# Patient Record
Sex: Female | Born: 1935 | Race: Black or African American | Hispanic: No | Smoking: Never smoker
Health system: Southern US, Community
[De-identification: ages and names within clinical notes are randomized; demographics above are authoritative.]

## PROBLEM LIST (undated history)

## (undated) DIAGNOSIS — F0390 Unspecified dementia without behavioral disturbance: Secondary | ICD-10-CM

## (undated) DIAGNOSIS — I050 Rheumatic mitral stenosis: Secondary | ICD-10-CM

## (undated) DIAGNOSIS — I10 Essential (primary) hypertension: Secondary | ICD-10-CM

## (undated) DIAGNOSIS — E785 Hyperlipidemia, unspecified: Secondary | ICD-10-CM

---

## 2007-05-10 ENCOUNTER — Ambulatory Visit (HOSPITAL_COMMUNITY): Admission: RE | Admit: 2007-05-10 | Discharge: 2007-05-11 | Payer: Self-pay | Admitting: Ophthalmology

## 2011-01-11 NOTE — Op Note (Signed)
Terry Shaw, Terry Shaw                  ACCOUNT NO.:  [id number]   MEDICAL RECORD NO.:  [id number]          PATIENT TYPE:  AMB   LOCATION:  SDS                          FACILITY:  [facility]   PHYSICIAN:  John D. Ashley Royalty, M.D. DATE OF BIRTH:  [birth year]   DATE OF PROCEDURE:  05/10/2007  DATE OF DISCHARGE:                               OPERATIVE REPORT   ADMISSION DIAGNOSES:  Retained lens material, glaucoma, capsular  remnants in the right eye.   PROCEDURES:  Pars plana vitrectomy with 25-gauge system, membrane peel,  capsulectomy, retinal photocoagulation in the right eye.   SURGEON:  Beulah Gandy. Ashley Royalty, M.D.   ASSISTANT:  Bryan Lemma. Lundquist, P.A.   ANESTHESIA:  General.   DETAILS:  Usual prep and drape, 25-gauge trocars at 8, 10 and 2 o'clock.  The infusion at 8 o'clock.  Provisc placed on the corneal surface.  The  lighted pick and the vitreous cutter were placed at 10 and 2 o'clock,  respectively.  Attention was carried to the anterior segment, where  vitreous attached to the iris and wound were encountered and carefully  removed under low suction and rapid cutting.  Capsular remnants were  seen as well as lens remnants seen in the anterior chamber.  These were  sucked from the edge of the lens from their position to the edge of the  lens in and then out to the vitreous cutter from behind.  The capsular  ring was trimmed and large clumps of lens material were encountered and  removed.  The lens appeared stable.  The vitrectomy was carried  posteriorly, where additional vitrectomy was carried out down to the  vitreous base and down to the macular surface.  The lighted pick was  used to engage the lens material itself and pick it from its attachments  to the retina, and the pick was used to engage posterior membranes on  the retinal surface and peel them from their attachments.  They were  removed with the vitreous cutter.  The eye was rotated extremely in all  directions so as to find  each and every lens particle.  The eye was  rinsed for a long period of time and a diligent search was made around  the implant and in the posterior pole to remove all lens particles.  The  attention was then carried back anteriorly again and additional trimming  of the posterior capsule was performed.  Then attention was carried  posteriorly again, where Endolaser was performed, 604 burns were placed  around the retinal periphery with a power of 1000 milliwatts, 1000  microns each and 0.1 second each.  The indirect ophthalmoscope laser was  also used to deliver 558 burns at the power of 660 milliwatts, 1000  microns each and 0.1 second each.  An additional washout procedure was  performed, each time finding small single lens remnants.  They were  brought into the vitrector and out of the eye.  Once the entire eye was  cleansed, the trocars were removed and the wounds were held until tight.  The conjunctiva was allowed  to slide over the scleral wounds.  Polymyxin  and gentamicin were irrigated into Tenon's space.  Marcaine was injected  around the globe for postop pain.  Decadron 10 mg was injected to the  lower subconjunctival space.  Closing pressure was less than 10 with a  [place] tonometer.   COMPLICATIONS:  None.   DURATION:  1 hour.   TobraDex ophthalmic ointment, a patch and shield were placed.  The  patient was awakened and taken to recovery in satisfactory condition.      Beulah Gandy. Ashley Royalty, M.D.  Electronically Signed     JDM/MEDQ  D:  05/10/2007  T:  05/11/2007  Job:  [id number]

## 2011-06-10 LAB — BASIC METABOLIC PANEL
BUN: 24 — ABNORMAL HIGH
Chloride: 110
Glucose, Bld: 101 — ABNORMAL HIGH
Potassium: 3.4 — ABNORMAL LOW
Sodium: 137

## 2011-06-10 LAB — CBC
HCT: 38.6
Hemoglobin: 12.8
MCV: 90.3
Platelets: 281
RDW: 13.5

## 2021-07-31 ENCOUNTER — Other Ambulatory Visit: Payer: Self-pay

## 2021-07-31 ENCOUNTER — Emergency Department: Payer: Medicare HMO

## 2021-07-31 ENCOUNTER — Encounter: Payer: Self-pay | Admitting: Interventional Cardiology

## 2021-07-31 ENCOUNTER — Encounter: Admission: EM | Disposition: E | Payer: Self-pay | Source: Home / Self Care | Attending: Internal Medicine

## 2021-07-31 DIAGNOSIS — T68XXXA Hypothermia, initial encounter: Secondary | ICD-10-CM

## 2021-07-31 DIAGNOSIS — Z79890 Hormone replacement therapy (postmenopausal): Secondary | ICD-10-CM

## 2021-07-31 DIAGNOSIS — I4510 Unspecified right bundle-branch block: Secondary | ICD-10-CM | POA: Diagnosis present

## 2021-07-31 DIAGNOSIS — I213 ST elevation (STEMI) myocardial infarction of unspecified site: Secondary | ICD-10-CM | POA: Diagnosis not present

## 2021-07-31 DIAGNOSIS — I9589 Other hypotension: Secondary | ICD-10-CM | POA: Diagnosis present

## 2021-07-31 DIAGNOSIS — R64 Cachexia: Secondary | ICD-10-CM | POA: Diagnosis present

## 2021-07-31 DIAGNOSIS — Z6829 Body mass index (BMI) 29.0-29.9, adult: Secondary | ICD-10-CM | POA: Diagnosis not present

## 2021-07-31 DIAGNOSIS — J690 Pneumonitis due to inhalation of food and vomit: Secondary | ICD-10-CM | POA: Diagnosis present

## 2021-07-31 DIAGNOSIS — D72829 Elevated white blood cell count, unspecified: Secondary | ICD-10-CM | POA: Diagnosis present

## 2021-07-31 DIAGNOSIS — F03C0 Unspecified dementia, severe, without behavioral disturbance, psychotic disturbance, mood disturbance, and anxiety: Secondary | ICD-10-CM | POA: Diagnosis present

## 2021-07-31 DIAGNOSIS — I2109 ST elevation (STEMI) myocardial infarction involving other coronary artery of anterior wall: Principal | ICD-10-CM | POA: Diagnosis present

## 2021-07-31 DIAGNOSIS — E039 Hypothyroidism, unspecified: Secondary | ICD-10-CM | POA: Diagnosis present

## 2021-07-31 DIAGNOSIS — R54 Age-related physical debility: Secondary | ICD-10-CM | POA: Diagnosis present

## 2021-07-31 DIAGNOSIS — N939 Abnormal uterine and vaginal bleeding, unspecified: Secondary | ICD-10-CM | POA: Diagnosis present

## 2021-07-31 DIAGNOSIS — Z20822 Contact with and (suspected) exposure to covid-19: Secondary | ICD-10-CM | POA: Diagnosis present

## 2021-07-31 DIAGNOSIS — N1832 Chronic kidney disease, stage 3b: Secondary | ICD-10-CM | POA: Diagnosis present

## 2021-07-31 DIAGNOSIS — I050 Rheumatic mitral stenosis: Secondary | ICD-10-CM | POA: Diagnosis present

## 2021-07-31 DIAGNOSIS — F0390 Unspecified dementia without behavioral disturbance: Secondary | ICD-10-CM

## 2021-07-31 DIAGNOSIS — E785 Hyperlipidemia, unspecified: Secondary | ICD-10-CM | POA: Diagnosis present

## 2021-07-31 DIAGNOSIS — Z66 Do not resuscitate: Secondary | ICD-10-CM | POA: Diagnosis present

## 2021-07-31 DIAGNOSIS — R680 Hypothermia, not associated with low environmental temperature: Secondary | ICD-10-CM | POA: Diagnosis present

## 2021-07-31 DIAGNOSIS — E782 Mixed hyperlipidemia: Secondary | ICD-10-CM | POA: Diagnosis not present

## 2021-07-31 DIAGNOSIS — I462 Cardiac arrest due to underlying cardiac condition: Secondary | ICD-10-CM | POA: Diagnosis present

## 2021-07-31 DIAGNOSIS — I129 Hypertensive chronic kidney disease with stage 1 through stage 4 chronic kidney disease, or unspecified chronic kidney disease: Secondary | ICD-10-CM | POA: Diagnosis present

## 2021-07-31 DIAGNOSIS — I472 Ventricular tachycardia, unspecified: Secondary | ICD-10-CM | POA: Diagnosis present

## 2021-07-31 DIAGNOSIS — R0789 Other chest pain: Secondary | ICD-10-CM | POA: Diagnosis present

## 2021-07-31 DIAGNOSIS — I10 Essential (primary) hypertension: Secondary | ICD-10-CM | POA: Diagnosis not present

## 2021-07-31 DIAGNOSIS — N183 Chronic kidney disease, stage 3 unspecified: Secondary | ICD-10-CM | POA: Diagnosis present

## 2021-07-31 DIAGNOSIS — Z79899 Other long term (current) drug therapy: Secondary | ICD-10-CM | POA: Diagnosis not present

## 2021-07-31 HISTORY — DX: Rheumatic mitral stenosis: I05.0

## 2021-07-31 HISTORY — DX: Hyperlipidemia, unspecified: E78.5

## 2021-07-31 HISTORY — DX: Unspecified dementia, unspecified severity, without behavioral disturbance, psychotic disturbance, mood disturbance, and anxiety: F03.90

## 2021-07-31 HISTORY — DX: Essential (primary) hypertension: I10

## 2021-07-31 LAB — CBC WITH DIFFERENTIAL/PLATELET
Abs Immature Granulocytes: 0.15 10*3/uL — ABNORMAL HIGH (ref 0.00–0.07)
Basophils Absolute: 0.1 10*3/uL (ref 0.0–0.1)
Basophils Relative: 1 %
Eosinophils Absolute: 0.2 10*3/uL (ref 0.0–0.5)
Eosinophils Relative: 2 %
HCT: 40.4 % (ref 36.0–46.0)
Hemoglobin: 12.6 g/dL (ref 12.0–15.0)
Immature Granulocytes: 1 %
Lymphocytes Relative: 54 %
Lymphs Abs: 6.8 10*3/uL — ABNORMAL HIGH (ref 0.7–4.0)
MCH: 29.4 pg (ref 26.0–34.0)
MCHC: 31.2 g/dL (ref 30.0–36.0)
MCV: 94.2 fL (ref 80.0–100.0)
Monocytes Absolute: 0.7 10*3/uL (ref 0.1–1.0)
Monocytes Relative: 5 %
Neutro Abs: 4.5 10*3/uL (ref 1.7–7.7)
Neutrophils Relative %: 37 %
Platelets: 180 10*3/uL (ref 150–400)
RBC: 4.29 MIL/uL (ref 3.87–5.11)
RDW: 14.6 % (ref 11.5–15.5)
WBC: 12.4 10*3/uL — ABNORMAL HIGH (ref 4.0–10.5)
nRBC: 0.2 % (ref 0.0–0.2)

## 2021-07-31 LAB — COMPREHENSIVE METABOLIC PANEL
ALT: 51 U/L — ABNORMAL HIGH (ref 0–44)
AST: 91 U/L — ABNORMAL HIGH (ref 15–41)
Albumin: 3.1 g/dL — ABNORMAL LOW (ref 3.5–5.0)
Alkaline Phosphatase: 67 U/L (ref 38–126)
Anion gap: 8 (ref 5–15)
BUN: 35 mg/dL — ABNORMAL HIGH (ref 8–23)
CO2: 17 mmol/L — ABNORMAL LOW (ref 22–32)
Calcium: 7.3 mg/dL — ABNORMAL LOW (ref 8.9–10.3)
Chloride: 114 mmol/L — ABNORMAL HIGH (ref 98–111)
Creatinine, Ser: 1.34 mg/dL — ABNORMAL HIGH (ref 0.44–1.00)
GFR, Estimated: 37 mL/min — ABNORMAL LOW (ref >60)
Glucose, Bld: 165 mg/dL — ABNORMAL HIGH (ref 70–99)
Potassium: 3.7 mmol/L (ref 3.5–5.1)
Sodium: 139 mmol/L (ref 135–145)
Total Bilirubin: 1.1 mg/dL (ref 0.3–1.2)
Total Protein: 5.5 g/dL — ABNORMAL LOW (ref 6.5–8.1)

## 2021-07-31 LAB — PROTIME-INR
INR: 1.4 — ABNORMAL HIGH (ref 0.8–1.2)
Prothrombin Time: 17.1 seconds — ABNORMAL HIGH (ref 11.4–15.2)

## 2021-07-31 LAB — LIPID PANEL
Cholesterol: 125 mg/dL (ref 0–200)
HDL: 43 mg/dL (ref >40)
LDL Cholesterol: 60 mg/dL (ref 0–99)
Total CHOL/HDL Ratio: 2.9 RATIO
Triglycerides: 112 mg/dL (ref <150)
VLDL: 22 mg/dL (ref 0–40)

## 2021-07-31 LAB — GLUCOSE, CAPILLARY: Glucose-Capillary: 126 mg/dL — ABNORMAL HIGH (ref 70–99)

## 2021-07-31 LAB — TROPONIN I (HIGH SENSITIVITY)
Troponin I (High Sensitivity): 50 ng/L — ABNORMAL HIGH (ref <18)
Troponin I (High Sensitivity): 23315 ng/L (ref <18)

## 2021-07-31 LAB — RESP PANEL BY RT-PCR (FLU A&B, COVID) ARPGX2
Influenza A by PCR: NEGATIVE
Influenza B by PCR: NEGATIVE
SARS Coronavirus 2 by RT PCR: NEGATIVE

## 2021-07-31 LAB — APTT: aPTT: 35 seconds (ref 24–36)

## 2021-07-31 SURGERY — CORONARY/GRAFT ACUTE MI REVASCULARIZATION

## 2021-07-31 MED ORDER — AMIODARONE IV BOLUS ONLY 150 MG/100ML
150.0000 mg | Freq: Once | INTRAVENOUS | Status: AC
  Administered 2021-07-31: 150 mg via INTRAVENOUS
  Filled 2021-07-31: qty 100

## 2021-07-31 MED ORDER — HEPARIN (PORCINE) 25000 UT/250ML-% IV SOLN
INTRAVENOUS | Status: AC
  Administered 2021-07-31: 14 [IU]/kg/h via INTRAVENOUS
  Filled 2021-07-31: qty 250

## 2021-07-31 MED ORDER — ASPIRIN 81 MG PO CHEW: 324.0000 mg | CHEWABLE_TABLET | Freq: Once | ORAL | Status: DC

## 2021-07-31 MED ORDER — SODIUM CHLORIDE 0.9 % IV SOLN
INTRAVENOUS | Status: DC
  Administered 2021-07-31: 1000 mL via INTRAVENOUS

## 2021-07-31 MED ORDER — SODIUM CHLORIDE 0.9 % IV SOLN
6.2500 mg | Freq: Four times a day (QID) | INTRAVENOUS | Status: DC | PRN
  Filled 2021-07-31 (×2): qty 0.25

## 2021-07-31 MED ORDER — LEVOTHYROXINE SODIUM 112 MCG PO TABS
112.0000 ug | ORAL_TABLET | Freq: Every day | ORAL | Status: DC
  Filled 2021-07-31: qty 1

## 2021-07-31 MED ORDER — ACETAMINOPHEN 650 MG RE SUPP: 650.0000 mg | Freq: Four times a day (QID) | RECTAL | Status: DC | PRN

## 2021-07-31 MED ORDER — PIPERACILLIN-TAZOBACTAM 3.375 G IVPB
3.3750 g | Freq: Three times a day (TID) | INTRAVENOUS | Status: DC
  Administered 2021-08-01: 01:00:00 3.375 g via INTRAVENOUS
  Filled 2021-07-31: qty 50

## 2021-07-31 MED ORDER — INSULIN ASPART 100 UNIT/ML IJ SOLN: 0.0000 [IU] | Freq: Three times a day (TID) | INTRAMUSCULAR | Status: DC

## 2021-07-31 MED ORDER — MORPHINE SULFATE (PF) 2 MG/ML IV SOLN
2.0000 mg | Freq: Once | INTRAVENOUS | Status: AC
  Administered 2021-07-31: 2 mg via INTRAVENOUS
  Filled 2021-07-31: qty 1

## 2021-07-31 MED ORDER — AMIODARONE IV BOLUS ONLY 150 MG/100ML: 150.0000 mg | Freq: Once | INTRAVENOUS | Status: DC | PRN

## 2021-07-31 MED ORDER — HEPARIN (PORCINE) 25000 UT/250ML-% IV SOLN: 1150.0000 [IU]/h | INTRAVENOUS | Status: DC

## 2021-07-31 MED ORDER — MORPHINE SULFATE (PF) 2 MG/ML IV SOLN: 2.0000 mg | INTRAVENOUS | Status: DC | PRN

## 2021-07-31 MED ORDER — CHLORHEXIDINE GLUCONATE CLOTH 2 % EX PADS
6.0000 | MEDICATED_PAD | Freq: Every day | CUTANEOUS | Status: DC
  Filled 2021-07-31: qty 6

## 2021-07-31 MED ORDER — LIDOCAINE HCL 1 % IJ SOLN
INTRAMUSCULAR | Status: AC
  Filled 2021-07-31: qty 20

## 2021-07-31 MED ORDER — ONDANSETRON HCL 4 MG/2ML IJ SOLN
4.0000 mg | Freq: Once | INTRAMUSCULAR | Status: AC
  Administered 2021-07-31: 4 mg via INTRAVENOUS
  Filled 2021-07-31: qty 2

## 2021-07-31 MED ORDER — INSULIN ASPART 100 UNIT/ML IJ SOLN: 0.0000 [IU] | Freq: Every day | INTRAMUSCULAR | Status: DC

## 2021-07-31 MED ORDER — ATORVASTATIN CALCIUM 20 MG PO TABS
80.0000 mg | ORAL_TABLET | Freq: Every day | ORAL | Status: DC
  Administered 2021-08-01: 01:00:00 80 mg via ORAL

## 2021-07-31 MED ORDER — ACETAMINOPHEN 500 MG PO TABS: 1000.0000 mg | ORAL_TABLET | Freq: Four times a day (QID) | ORAL | Status: DC | PRN

## 2021-07-31 MED ORDER — MIDAZOLAM HCL 2 MG/2ML IJ SOLN: 2.0000 mg | INTRAMUSCULAR | Status: DC | PRN

## 2021-07-31 MED ORDER — ONDANSETRON HCL 4 MG/2ML IJ SOLN: 4.0000 mg | Freq: Four times a day (QID) | INTRAMUSCULAR | Status: DC | PRN

## 2021-07-31 MED ORDER — HEPARIN SODIUM (PORCINE) 5000 UNIT/ML IJ SOLN
60.0000 [IU]/kg | Freq: Once | INTRAMUSCULAR | Status: AC
  Administered 2021-07-31: 4000 [IU] via INTRAVENOUS

## 2021-07-31 MED ORDER — ONDANSETRON HCL 4 MG PO TABS: 4.0000 mg | ORAL_TABLET | Freq: Four times a day (QID) | ORAL | Status: DC | PRN

## 2021-07-31 NOTE — Consult Note (Signed)
Pharmacy Antibiotic Note  Terry Shaw is a 87 y.o. female admitted on 07/30/2021 with  aspiration pneumonia .  Pharmacy has been consulted for Zosyn dosing.  Plan: Zosyn 3.375g IV q8h (4 hour infusion).  Height: 5\' 6"  (167.6 cm) Weight: 81.6 kg (180 lb) IBW/kg (Calculated) : 59.3  Temp (24hrs), Avg:95.9 F (35.5 C), Min:95.9 F (35.5 C), Max:95.9 F (35.5 C)  Recent Labs  Lab 08/02/2021 1650  WBC 12.4*  CREATININE 1.34*    Estimated Creatinine Clearance: 27.6 mL/min (A) (by C-G formula based on SCr of 1.34 mg/dL (H)).    No Known Allergies  Antimicrobials this admission: 12/3 Zosyn >>   Dose adjustments this admission: none  Microbiology results: 12/3 MRSA PCR: pending  Thank you for allowing pharmacy to be a part of this patient's care.  Ahmadou Bolz Rodriguez-Guzman PharmD, BCPS 08/19/2021 10:01 PM

## 2021-07-31 NOTE — ED Provider Notes (Signed)
[facility] Emergency Department Provider Note  ____________________________________________  Time seen: Approximately 6:24 PM  I have reviewed the triage vital signs and the nursing notes.   HISTORY  Chief Complaint Chest Pain    Level 5 Caveat: Portions of the History and Physical including HPI and review of systems are unable to be completely obtained due to patient advanced dementia  HPI Terry Shaw is a 87 y.o. female with a history of hypertension hyperlipidemia and dementia who was riding in a car driven by her daughter when she suddenly appeared ill and in distress.  The daughter pulled the car over and called EMS.  On their arrival they performed an EKG which showed a clear STEMI.  While placing an IV, the patient had a loss of pulses which they determined to be PEA.  They gave 1 amp of epinephrine and 15 minutes of CPR with return of spontaneous circulation.  On arrival, patient does not respond to questioning, but is clutching her chest and crying.    Past Medical History:  Diagnosis Date   Dementia (HCC)    Hyperlipidemia    Hypertension    Mitral stenosis      Patient Active Problem List   Diagnosis Date Noted   STEMI (ST elevation myocardial infarction) (HCC) 08/07/2021        Prior to Admission medications   Medication Sig Start Date End Date Taking? Authorizing Provider  benazepril (LOTENSIN) 20 MG tablet Take 20 mg by mouth daily. 07/26/21  Yes [provider]  citalopram (CELEXA) 10 MG tablet Take 10 mg by mouth daily. 07/26/21  Yes [provider]  donepezil (ARICEPT) 5 MG tablet Take 5 mg by mouth daily. 07/26/21  Yes [provider]  fluticasone (FLONASE) 50 MCG/ACT nasal spray Place 2 sprays into both nostrils daily at 6 (six) AM. 07/26/21  Yes [provider]  levothyroxine (SYNTHROID) 112 MCG tablet Take 112 mcg by mouth daily. 07/26/21  Yes [provider]  lovastatin (MEVACOR)  40 MG tablet Take 40 mg by mouth daily. 07/26/21  Yes [provider]  memantine (NAMENDA XR) 14 MG CP24 24 hr capsule Take 14 mg by mouth daily at 6 (six) AM. 07/26/21  Yes [provider]     Allergies Patient has no allergy information on record.   No family history on file.  Social History    Review of Systems Level 5 Caveat: Portions of the History and Physical including HPI and review of systems are unable to be completely obtained due to patient being a poor historian   Constitutional:   No known fever.  ENT:   No rhinorrhea. Cardiovascular:   Positive chest pain . Respiratory:   No dyspnea or cough. Gastrointestinal:   Negative for abdominal pain, vomiting and diarrhea.  Musculoskeletal:   Negative for focal pain or swelling ____________________________________________   PHYSICAL EXAM:  VITAL SIGNS: ED Triage Vitals  Enc Vitals Group     BP 07/29/2021 1700 92/60     Pulse Rate 08/08/2021 1700 66     Resp 08/13/2021 1700 (!) 22     Temp 08/21/2021 1743 (!) 95.9 F (35.5 C)     Temp Source 07/30/2021 1743 Axillary     SpO2 08/28/2021 1700 92 %     Weight 08/03/2021 1702 180 lb (81.6 kg)     Height 07/31/21 1702 5\' 6"  (1.676 m)     Head Circumference --      Peak Flow --  Pain Score 07/30/2021 1645 10     Pain Loc --      Pain Edu? --      Excl. in GC? --     Vital signs reviewed, nursing assessments reviewed.   Constitutional:   Awake, not alert, not oriented.  In distress. Eyes:   Conjunctivae are normal.  PERRL. ENT      Head:   Normocephalic and atraumatic.      Nose:   No congestion/rhinnorhea.             Neck:   No meningismus. Full ROM. Hematological/Lymphatic/Immunilogical:   No cervical lymphadenopathy. Cardiovascular:   Tachycardia heart rate 110. Symmetric bilateral radial and DP pulses.  No murmurs. Cap refill less than 2 seconds. Respiratory:   Normal respiratory effort without tachypnea/retractions. Breath sounds are clear and  equal bilaterally. No wheezes/rales/rhonchi. Gastrointestinal:   Soft and nontender. Non distended. There is no CVA tenderness.  No rebound, rigidity, or guarding. Genitourinary:   deferred Musculoskeletal:   Normal range of motion in all extremities. No joint effusions.  No lower extremity tenderness.  No edema. Neurologic:   Normal speech, very limited language, not conversant..  Motor grossly intact.  Skin:    Skin is warm, dry and intact. No rash noted.  No petechiae, purpura, or bullae.  ____________________________________________    LABS (pertinent positives/negatives) (all labs ordered are listed, but only abnormal results are displayed) Labs Reviewed  CBC WITH DIFFERENTIAL/PLATELET - Abnormal; Notable for the following components:      Result Value   WBC 12.4 (*)    Lymphs Abs 6.8 (*)    Abs Immature Granulocytes 0.15 (*)    All other components within normal limits  PROTIME-INR - Abnormal; Notable for the following components:   Prothrombin Time 17.1 (*)    INR 1.4 (*)    All other components within normal limits  COMPREHENSIVE METABOLIC PANEL - Abnormal; Notable for the following components:   Chloride 114 (*)    CO2 17 (*)    Glucose, Bld 165 (*)    BUN 35 (*)    Creatinine, Ser 1.34 (*)    Calcium 7.3 (*)    Total Protein 5.5 (*)    Albumin 3.1 (*)    AST 91 (*)    ALT 51 (*)    GFR, Estimated 37 (*)    All other components within normal limits  TROPONIN I (HIGH SENSITIVITY) - Abnormal; Notable for the following components:   Troponin I (High Sensitivity) 50 (*)    All other components within normal limits  RESP PANEL BY RT-PCR (FLU A&B, COVID) ARPGX2  APTT  LIPID PANEL  HEMOGLOBIN A1C  HEPARIN LEVEL (UNFRACTIONATED)  CBC  TROPONIN I (HIGH SENSITIVITY)   ____________________________________________   EKG  All EKGs interpreted by me Prehospital EKG shows a clear STEMI with ST elevation in 1, aVL, V4 through V6 and ST depression in the inferior  leads.  Initial ED EKG shows sinus tachycardia, rate of 113.  Left axis, right bundle branch block, normal ST segments and T waves, no acute ischemic changes.  BP patient's repeat EKG shows sinus rhythm with first-degree AV block, rate of 70, right bundle branch block, frequent bigeminy.  ____________________________________________    RADIOLOGY  DG Chest Port 1 View  Result Date: 08/06/2021 CLINICAL DATA:  Chest pain EXAM: PORTABLE CHEST 1 VIEW COMPARISON:  05/10/2007 FINDINGS: There is poor inspiration. Transverse diameter of heart is increased. Central pulmonary vessels are prominent. There is no  focal pulmonary consolidation. Left hemidiaphragm is elevated. There is blunting of left lateral CP angle. There is no pneumothorax. Severe degenerative changes are noted in the left shoulder. Degenerative changes to a lesser extent are noted in the right shoulder. IMPRESSION: Cardiomegaly. There are no signs of alveolar pulmonary edema or focal pulmonary consolidation. Blunting of left lateral CP angle may be due to minimal effusion or pleural thickening. Electronically Signed   By: Ernie Avena M.D.   On: 08-22-2021 17:22    ____________________________________________   PROCEDURES Procedures  ____________________________________________    CLINICAL IMPRESSION / ASSESSMENT AND PLAN / ED COURSE  Medications ordered in the ED: Medications  0.9 %  sodium chloride infusion (1,000 mLs Intravenous New Bag/Given 2021-08-22 1650)  aspirin chewable tablet 324 mg (0 mg Oral Hold August 22, 2021 1741)  heparin ADULT infusion 100 units/mL (25000 units/211mL) (14 Units/kg/hr  81.6 kg Intravenous New Bag/Given 08-22-21 1739)  heparin injection 60 Units/kg (4,000 Units Intravenous Given 2021-08-22 1700)    Pertinent labs & imaging results that were available during my care of the patient were reviewed by me and considered in my medical decision making (see chart for details).   Berdia Lachman was evaluated  in Emergency Department on 08-22-2021 for the symptoms described in the history of present illness. She was evaluated in the context of the global COVID-19 pandemic, which necessitated consideration that the patient might be at risk for infection with the SARS-CoV-2 virus that causes COVID-19. Institutional protocols and algorithms that pertain to the evaluation of patients at risk for COVID-19 are in a state of rapid change based on information released by regulatory bodies including the CDC and federal and state organizations. These policies and algorithms were followed during the patient's care in the ED.   Patient presents with clear STEMI and out of hospital cardiac arrest now with spontaneous circulation.  Blood pressure initially hypotensive, responding to fluids.  She is not septic on arrival, and presentation is due to ACS.  In consultation with cardiology, they have advised that cardiac catheterization would be unlikely to benefit the patient, recommend conservative management.  They have discussed this with the family who agree.  Family also understands further resuscitative efforts with CPR or intubation if needed would be very unlikely to produce favorable recovery, and they accept DNR/no escalation/invasive procedures.  CODE STATUS is DNR.      ____________________________________________   FINAL CLINICAL IMPRESSION(S) / ED DIAGNOSES    Final diagnoses:  ST elevation myocardial infarction (STEMI), unspecified artery (HCC)  Severe dementia, unspecified dementia type, unspecified whether behavioral, psychotic, or mood disturbance or anxiety     ED Discharge Orders     None       Portions of this note were generated with dragon dictation software. Dictation errors may occur despite best attempts at proofreading.   Sharman Cheek, MD 08-22-21 [phone]

## 2021-07-31 NOTE — Progress Notes (Signed)
Chaplain Maggie followed up per spiritual care consult from physician for prayer with patient.

## 2021-07-31 NOTE — Consult Note (Addendum)
Cardiology Consultation:   Patient ID: Terry Shaw MRN: [id number]; DOB: 02-23-1925  Admit date: Aug 25, 2021 Date of Consult: 2021/08/25  PCP:  No primary care provider on file.   [facility] Providers Cardiologist:  Dr. Vonzella Nipple, [facility] Click here to update MD or APP on Care Team, Refresh:1}     Patient Profile:   Terry Shaw is a 87 y.o. female with a hx of mitral stenosis who is being seen Aug 25, 2021 for the evaluation of chest pain at the request of Dr. Scotty Court.  History of Present Illness:   History obtained from EMS and the daughter.  Patient has dementia and is not answering questions.  Terry Shaw has several medical issues including hypertension, hyperlipidemia and mitral stenosis.  She is followed by Dr. Vonzella Nipple at [facility] cardiology.  She was riding in the car today and the daughter noticed that she was shifting her upper body.  She asked if she was feeling pain.  The patient said she was having pain.  They pulled over on the side of the road and called EMS.  Initial ECG revealed anterolateral ST elevation.  Code STEMI was called.  While they were placing an IV, the patient became asystolic.  She required several minutes of CPR and pulse was regained.  Upon arrival to the ER, ECG in the emergency room showed that the ST segment abnormalities had resolved.  Initially, the patient was very uncomfortable.  This resolved spontaneously and she did describe "pain all over", but this was different compared to what she initially came into the emergency room with.  Further history from the daughter reveals that the patient does take medications for dementia.  She lives with the daughter.  The daughter has had to make the patient drink plenty of fluids.  The patient does not do this on her own and has had issues with dehydration in the past requiring emergency room care.  She does take a few medications.  The patient is just repeating to herself "Lord help me."  She does not appear short of  breath.   Past Medical History:  Diagnosis Date   Dementia (HCC)    Hyperlipidemia    Hypertension    Mitral stenosis     Social history: She lives with a daughter.  No smoking.  Family history: Unable to obtain as the patient is unable to answer questions.    Inpatient Medications: Scheduled Meds:  aspirin  324 mg Oral Once   Continuous Infusions:  sodium chloride 1,000 mL (08-25-21 1650)   heparin 14 Units/kg/hr (2021-08-25 1739)   PRN Meds:   Allergies:   Not on File NKDA  Social History:   Social History   Socioeconomic History   Marital status: Widowed    Spouse name: Not on file   Number of children: Not on file   Years of education: Not on file   Highest education level: Not on file  Occupational History   Not on file  Tobacco Use   Smoking status: Not on file   Smokeless tobacco: Not on file  Substance and Sexual Activity   Alcohol use: Not on file   Drug use: Not on file   Sexual activity: Not on file  Other Topics Concern   Not on file  Social History Narrative   Not on file   Social Determinants of Health   Financial Resource Shaw: Not on file  Food Insecurity: Not on file  Transportation Needs: Not on file  Physical Activity: Not on file  Stress: Not on file  Social Connections: Not on file  Intimate Partner Violence: Not on file    Family History:   Unknown as the patient is unable to provide this information  ROS:  Please see the history of present illness.  Pain all over All other ROS reviewed and negative.     Physical Exam/Data:   Vitals:   2021-08-20 1715 08/20/21 1730 08-20-21 1740 08-20-21 1743  BP: 91/61 98/71 107/74   Pulse: 64     Resp: (!) 27 (!) 27 (!) 22   Temp:    (!) 95.9 F (35.5 C)  TempSrc:    Axillary  SpO2: 100% 95% 96%   Weight:      Height:       No intake or output data in the 24 hours ending Aug 20, 2021 1807 Last 3 Weights Aug 20, 2021  Weight (lbs) 180 lb  Weight (kg) 81.647 kg     Body mass index  is 29.05 kg/m.  General:  Well nourished, well developed, in mild distress but much improved HEENT: normal Neck: no JVD Vascular: No carotid bruits; Distal pulses 2+ bilaterally Cardiac:  normal S1, S2; RRR; Lungs: Facemask in place, no obvious wheezing;  Abd: soft, nontender, no hepatomegaly  Ext: Compression stocking noted on the right leg Musculoskeletal:  No deformities, BUE and BLE strength normal and equal Skin: warm and dry  Neuro:  CNs 2-12 intact, no focal abnormalities noted, not answering questions Psych: Anxious affect   EKG: EMS ECG clearly shows anterolateral ST elevation MI.  The EKG from the ER was also personally reviewed and demonstrates: Normal sinus rhythm, right bundle branch block pattern with resolution of ST abnormality  ECG done in the emergency room Telemetry:  Telemetry was personally reviewed and demonstrates: Normal sinus rhythm  Relevant CV Studies: None available  Laboratory Data:  High Sensitivity Troponin:   Recent Labs  Lab 08/20/21 1650  TROPONINIHS 50*     Chemistry Recent Labs  Lab 08-20-2021 1650  NA 139  K 3.7  CL 114*  CO2 17*  GLUCOSE 165*  BUN 35*  CREATININE 1.34*  CALCIUM 7.3*  GFRNONAA 37*  ANIONGAP 8    Recent Labs  Lab 20-Aug-2021 1650  PROT 5.5*  ALBUMIN 3.1*  AST 91*  ALT 51*  ALKPHOS 67  BILITOT 1.1   Lipids  Recent Labs  Lab 08-20-2021 1650  CHOL 125  TRIG 112  HDL 43  LDLCALC 60  CHOLHDL 2.9    Hematology Recent Labs  Lab August 20, 2021 1650  WBC 12.4*  RBC 4.29  HGB 12.6  HCT 40.4  MCV 94.2  MCH 29.4  MCHC 31.2  RDW 14.6  PLT 180   Thyroid No results for input(s): TSH, FREET4 in the last 168 hours.  BNPNo results for input(s): BNP, PROBNP in the last 168 hours.  DDimer No results for input(s): DDIMER in the last 168 hours.   Radiology/Studies:  DG Chest Port 1 View  Result Date: Aug 20, 2021 CLINICAL DATA:  Chest pain EXAM: PORTABLE CHEST 1 VIEW COMPARISON:  05/10/2007 FINDINGS: There is poor  inspiration. Transverse diameter of heart is increased. Central pulmonary vessels are prominent. There is no focal pulmonary consolidation. Left hemidiaphragm is elevated. There is blunting of left lateral CP angle. There is no pneumothorax. Severe degenerative changes are noted in the left shoulder. Degenerative changes to a lesser extent are noted in the right shoulder. IMPRESSION: Cardiomegaly. There are no signs of alveolar pulmonary edema or focal pulmonary consolidation. Blunting of  left lateral CP angle may be due to minimal effusion or pleural thickening. Electronically Signed   By: Elmer Picker M.D.   On: 08/19/2021 17:22     Assessment and Plan:   Acute anterolateral ST elevation MI: Resolution of ECG changes in the ER with significant improvement in pain.  I had a long discussion with the daughter.  There were no advanced directives.  They had never discussed CODE STATUS or how aggressive medical therapy should be in the future.  The patient's lifestyle is somewhat limited by her dementia.  She is also not very physically active.  I think the risk of cardiac catheterization would be relatively high in this patient, and it is unlikely that we may find a single abnormality upon which to intervene.  The daughter's top priority is to treat any pain that she may be having.  We discussed catheterization versus conservative management with IV anticoagulation and IV pain medications.  Given her advanced age and other comorbidities, we decided on conservative therapy.  IV heparin drip was started.  I discussed this plan with Dr. Joni Fears as well.  She did have some nonsustained ventricular tachycardia in the emergency room.  Could use IV amiodarone if this became more sustained.  I also spoke to the daughter about CODE STATUS.  I think if she had a VT/VF arrest, she would be very unlikely to make a meaningful recovery.  The daughter was also concerned about the discomfort of pushing on the chest for  CPR.  The daughter will consider what they want done.  I think in this situation, a DNR is very reasonable. Hyperlipidemia: Could certainly consider more high potency statin compared to lovastatin which she has been taking.  Would use either atorvastatin 80 or rosuvastatin 40 mg daily as part of medical therapy. Hypertension: Blood pressure stable.  We will hold blood pressure medications and keep some room for pain medicines as needed. Mitral stenosis: No obvious CHF on exam.  CKD stage III, GFR 37.  Based on GFR, Stage 3B.  CRITICAL CARE Performed by: Larae Grooms   Total critical care time: 60 minutes  Critical care time was exclusive of separately billable procedures and treating other patients.  Critical care was necessary to treat or prevent imminent or life-threatening deterioration.  Critical care was time spent personally by me on the following activities: development of treatment plan with patient and/or surrogate as well as nursing, discussions with consultants, evaluation of patient's response to treatment, examination of patient, obtaining history from patient or surrogate, ordering and performing treatments and interventions, ordering and review of laboratory studies, ordering and review of radiographic studies, pulse oximetry and re-evaluation of patient's condition.     Risk Assessment/Risk Scores:     TIMI Risk Score for ST  Elevation MI:   The patient's TIMI risk score is 12, which indicates a 35.9% risk of all cause mortality at 30 days.        For questions or updates, please contact [place] Please consult [email or web address] for contact info under    Signed, Larae Grooms, MD  07/30/2021 6:07 PM

## 2021-07-31 NOTE — ED Triage Notes (Signed)
"  Called out to side of the road, she was complaining of Chest pain, became pulseless and did CPR x 2 minutes and got rosc" per EMS  [place] [facility] [place]

## 2021-07-31 NOTE — H&P (Addendum)
History and Physical   Terry Shaw VQQ:[id number] DOB: 1924/12/03 DOA: August 28, 2021  PCP: System, Provider Not In  Outpatient Specialists: Dr. Vonzella Nipple, cardiology Patient coming from: road side ems called  I have personally briefly reviewed patient's old medical records in [facility] EMR.  Chief Concern: STEMI  HPI: Terry Shaw is a 87 y.o. female with medical history significant for hypertension, moderate to advanced dementia, hypothyroid, hyperlipidemia, who presents emergency department via EMS for chief concerns of STEMI.  At bedside patient is able to whisper to me her name.  She was not able to tell me her age.  She mumbles and endorses that she is in pain everywhere and exclaiming, "Lord [id number] me Jesus".  GCS score of 13.  Daughter, Terry Shaw at bedside states that for the past 2 to 3 weeks, patient has been increasingly weak and unable to get herself up without assistance. They were on their way to get a lift to help her up. During the outing, patient endorsed to her daughter that she felt hungry. Her daughter got her some food, chicken nuggets. After eating, daughter noticed that patient did not appear well and she was shuffling her feet.  Daughter stopped the car on the side of the road and called 911.  EMS arrived, EKG was done and showed a STEMI. Patient then became pulseless. She had 2 rounds of chest compression, and 1 treatments of epinephrine. EMS was able to get ROSC and patient was sent to the emergency department for further evaluation.  Social history: She lives at home with her daughter, Terry Shaw.  Family denies history of tobacco use, EtOH, recreational drug use.  Vaccination history: Patient is vaccinated for COVID-19 including boosters.  ROS: Unable to complete due to acuity and advanced dementia.  ED Course: Discussed with emergency medicine provider, patient requiring hospitalization for chief concerns of STEMI in the field.  Vitals in the emergency department showed temperature  of 95.9, respiration rate of 22, initial heart rate of 66 and now improved to 73, initial blood pressure 92/60, improved to 107/74, patient was taken to the emergency department on nonrebreather with an SPO2 saturation of 92%.  Labs in the emergency department showed serum sodium of 139, potassium 3.7, chloride 114, bicarb 17, BUN of 35, serum creatinine of 1.34, nonfasting blood glucose 165, GFR 37.  AST was elevated at 91.  ALT 51.  Albumin was 3.1.  Patient had leukocytosis of 12.4, hemoglobin 12.6, platelets of 180.  Her lipid panel was within normal limits.  COVID/influenza A/influenza B PCR were negative.  Prior to presentation to the emergency department, EMS was called on the road and patient was found to have a STEMI and pulseless.  Patient underwent 2 rounds of CPR, and 1 dose of epinephrine.  Patient got ROSC and was sent to the emergency department for further evaluation.  Cardiology STEMI service was consulted and recommended avoid aggressive intervention.  In the emergency department patient was given heparin GTT, aspirin 324 mg p.o. once.  Hospital course: Extensive discussion with family at bedside regarding CODE STATUS and poor prognosis.  Well at bedside, patient vomited and I explained to the family that patient will likely have aspiration pneumonia at this time and this is frequently seen in patients sustaining chest compression.  Review I did I reevaluated patient at bedside.  Patient is sitting up and alert and does not appear to be in acute distress at this time.  Patient was able to tell me her name and unable to tell  me where she is, her current age, the current calendar year.  When I ask her to identify her daughter Terry Shaw whom she lives with at bedside she does states that this is her family.  She was not able to tell me [name]'s name and or her that Terry Shaw is her daughter.    Assessment/Plan  Principal Problem:   STEMI (ST elevation myocardial infarction) [facility]) Active  Problems:   Essential hypertension   Hypothyroidism (acquired)   Hypothermia   Aspiration pneumonia (HCC)   Leukocytosis   Dementia (HCC)   # STEMI - Continue heparin at this time as family is not ready for comfort measures - Complete echo ordered for a.m. of 2021/08/06 - Agree with cardiology, patient is not a candidate for intravascular intervention at this time - Goal blood glucose control is 140-180: Insulin SSI with at bedtime coverage has been ordered  # V. tach on telemetry-amiodarone 150 once - Amiodarone 150 IV bolus as needed for V. tach ordered  # End-of-life and goals of care discussion - Extensive discussion was done with both healthcare power of attorney, daughters at bedside, Terry Shaw and [name] - Both daughters did endorse that they did not want patient to have further chest compression as this would cause her more pain - Daughters however is not ready for comfort care at this time -They endorsed to me that they want their mother to live however they also do not want her to suffer in pain - Chaplain for spiritual consultation has been ordered - If family is not ready to make a comfort care decision, would recommend a.m. team to consult palliative team for further assistance  # History of hypertension-currently patient is borderline normotensive to low normotensive to hypotension at this time - I will hold all antihypertensive medications  # History of hypothyroidism-levothyroxine 112 mcg daily resumed for 08/02/2019 2 in the AM  # Hyperlipidemia-patient was formally on lovastatin-I will increase to high intensity atorvastatin 80 mg  # Leukocytosis # At risk of aspiration pna - Zosyn per pharmacy for aspiration pneumonia ordered  # Moderate to advance dementia-appears to be at baseline per family  # As needed medications: Midazolam 2 mg IV every 1 hours as needed for seizures, 2 doses ordered; morphine 2 mg IV every 3 hours as needed for severe pain, 1 day ordered,  Zofran, Phenergan, insulin SSI with at bedtime coverage  # CODE STATUS-confirmed multiple times with both Terry Shaw and [name] at bedside that they do not want chest compressions.  When I ask about intubation and/or if patient is not able to breathe with daughters want patient to have a tube and a machine to help breathe for her and they endorse no. Terry Shaw would like to continue heparin GGT, antibiotics, and as needed medications as above  # Vaginal bleeding-I was messaged by ED nurse stating that patient had moderate, third day menstrual bleeding like when patient was changed in the ED prior to going to the ICU.  Per family this is new for patient, as her daughter changes her every day.  ICU nurse notes that there was no more bleeding when patient arrived on her floor and a pure wick was placed. - I reevaluated patient at bedside and there was no signs of overt bleeding and or bleeding externally that I can see - Given that patient recently had a STEMI, and she has not had a pelvic exam in a long time and her age, pelvic exam was deferred as it may cause  unnecessary distress - Discussed with family at bedside that once patient is able to be discharged home in stable, she would benefit from evaluation by OB/GYN doctor outpatient, family endorses understanding and agreement  Chart reviewed.   DVT prophylaxis: Heparin GGT Code Status: DNR/DNI Diet: N.p.o. Family Communication: Extensive discussion with daughters and healthcare power of attorney at bedside, Terry Shaw and [name] Disposition Plan: Poor prognosis, anticipate death in the hospital Consults called: Cardiology Admission status: Stepdown, inpatient  Past Medical History:  Diagnosis Date   Dementia (HCC)    Hyperlipidemia    Hypertension    Mitral stenosis    History reviewed. No pertinent surgical history.  Social History:  reports that she has never smoked. She has never used smokeless tobacco. She reports that she does not drink  alcohol and does not use drugs.  No Known Allergies Family History  Problem Relation Age of Onset   Osteoarthritis Daughter    Family history: Family history reviewed and not pertinent  Prior to Admission medications   Medication Sig Start Date End Date Taking? Authorizing Provider  benazepril (LOTENSIN) 20 MG tablet Take 20 mg by mouth daily. 07/26/21  Yes [provider]  citalopram (CELEXA) 10 MG tablet Take 10 mg by mouth daily. 07/26/21  Yes [provider]  donepezil (ARICEPT) 5 MG tablet Take 5 mg by mouth daily. 07/26/21  Yes [provider]  fluticasone (FLONASE) 50 MCG/ACT nasal spray Place 2 sprays into both nostrils daily at 6 (six) AM. 07/26/21  Yes [provider]  levothyroxine (SYNTHROID) 112 MCG tablet Take 112 mcg by mouth daily. 07/26/21  Yes [provider]  lovastatin (MEVACOR) 40 MG tablet Take 40 mg by mouth daily. 07/26/21  Yes [provider]  memantine (NAMENDA XR) 14 MG CP24 24 hr capsule Take 14 mg by mouth daily at 6 (six) AM. 07/26/21  Yes [provider]   Physical Exam: Vitals:   2021-08-11 1743 11-Aug-2021 2015 August 11, 2021 2100 11-Aug-2021 2130  BP:  122/79 126/75 117/71  Pulse:  62 66 79  Resp:  (!) 23 (!) 25 19  Temp: (!) 95.9 F (35.5 C)     TempSrc: Axillary     SpO2:  100% 100% 100%  Weight:      Height:       Constitutional: appears age-appropriate, frail, cachectic, NAD, calm, comfortable Eyes: PERRL, lids and conjunctivae normal HENMT: Bilateral temporal wasting, mucous membranes are dry. Posterior pharynx clear of any exudate or lesions. Age-appropriate dentition.  Unable to assess hearing Neck: normal, supple, no masses, no thyromegaly Respiratory: clear to auscultation bilaterally, no wheezing, no crackles.  Mild increase respiratory effort. No accessory muscle use.  Cardiovascular: Irregular rate and rhythm. no murmurs / rubs / gallops. No extremity edema. 2+ pedal pulses. No carotid  bruits.  Abdomen: Obese abdomen, no tenderness, no masses palpated, no hepatosplenomegaly. Bowel sounds positive.  Musculoskeletal: no clubbing / cyanosis. No joint deformity upper and lower extremities. Good ROM, no contractures, no atrophy. Normal muscle tone.  Skin: no rashes, lesions, ulcers. No induration Neurologic: Sensation intact. Strength 5/5 in all 4.  Psychiatric: Acute situation, lacks judgment and insight. Alert and oriented x self.  Depressed mood.  EKG: independently reviewed, showing initial EKG in the field which showed a STEMI.  Repeat EKG on initial ED presentation showed sinus tachycardia with rate of 113, right bundle branch block, QTC 630.  Another repeat of EKG showed low amplitude, sinus rhythm with rate of 67, QTc 515  Chest  x-ray on Admission: I personally reviewed and I agree with radiologist reading as below.  DG Chest Port 1 View  Result Date: 08-21-2021 CLINICAL DATA:  Chest pain EXAM: PORTABLE CHEST 1 VIEW COMPARISON:  05/10/2007 FINDINGS: There is poor inspiration. Transverse diameter of heart is increased. Central pulmonary vessels are prominent. There is no focal pulmonary consolidation. Left hemidiaphragm is elevated. There is blunting of left lateral CP angle. There is no pneumothorax. Severe degenerative changes are noted in the left shoulder. Degenerative changes to a lesser extent are noted in the right shoulder. IMPRESSION: Cardiomegaly. There are no signs of alveolar pulmonary edema or focal pulmonary consolidation. Blunting of left lateral CP angle may be due to minimal effusion or pleural thickening. Electronically Signed   By: Ernie Avena M.D.   On: 08/21/2021 17:22    Labs on Admission: I have personally reviewed following labs  CBC: Recent Labs  Lab August 21, 2021 1650  WBC 12.4*  NEUTROABS 4.5  HGB 12.6  HCT 40.4  MCV 94.2  PLT 180   Basic Metabolic Panel: Recent Labs  Lab 08-21-2021 1650  NA 139  K 3.7  CL 114*  CO2 17*  GLUCOSE  165*  BUN 35*  CREATININE 1.34*  CALCIUM 7.3*   GFR: Estimated Creatinine Clearance: 27.6 mL/min (A) (by C-G formula based on SCr of 1.34 mg/dL (H)).  Liver Function Tests: Recent Labs  Lab 08-21-2021 1650  AST 91*  ALT 51*  ALKPHOS 67  BILITOT 1.1  PROT 5.5*  ALBUMIN 3.1*   Coagulation Profile: Recent Labs  Lab August 21, 2021 1650  INR 1.4*   Lipid Profile: Recent Labs    08-21-2021 1650  CHOL 125  HDL 43  LDLCALC 60  TRIG 112  CHOLHDL 2.9   CRITICAL CARE Performed by: Nadyne Coombes Korra Christine  Total critical care time: 60 minutes  Critical care time was exclusive of separately billable procedures and treating other patients.  Critical care was necessary to treat or prevent imminent or life-threatening deterioration.  Cardiac arrest, circulatory failure  Critical care was time spent personally by me on the following activities: development of treatment plan with patient and/or surrogate as well as nursing, discussions with consultants, evaluation of patient's response to treatment, examination of patient, obtaining history from patient or surrogate, ordering and performing treatments and interventions, ordering and review of laboratory studies, ordering and review of radiographic studies, pulse oximetry and re-evaluation of patient's condition.  Dr. Sedalia Muta [facility]  If 7PM-7AM, please contact overnight-coverage provider If 7AM-7PM, please contact day coverage provider [email or web address]  08-21-21, 9:45 PM

## 2021-07-31 NOTE — ED Notes (Signed)
Patient transported to ICU via stretcher on monitor with RN, stable at transfer. 

## 2021-07-31 NOTE — H&P (Incomplete Revision)
History and Physical   Terry Shaw ZOX:[id number] DOB: 1924/09/25 DOA: 07/29/2021  PCP: System, Provider Not In  Outpatient Specialists: Dr. Vonzella Nipple, cardiology Patient coming from: road side ems called  I have personally briefly reviewed patient's old medical records in [facility] EMR.  Chief Concern: STEMI  HPI: Terry Shaw is a 87 y.o. female with medical history significant for hypertension, moderate to advanced dementia, hypothyroid, hyperlipidemia, who presents emergency department via EMS for chief concerns of STEMI.  At bedside patient is able to whisper to me her name.  She was not able to tell me her age.  She mumbles and endorses that she is in pain everywhere and exclaiming, "Lord [id number] me Jesus".  GCS score of 13.  Daughter, [name] at bedside states that for the past 2 to 3 weeks, patient has been increasingly weak and unable to get herself up without assistance. They were on their way to get a lift to help her up. During the outing, patient endorsed to her daughter that she felt hungry. Her daughter got her some food, chicken nuggets. After eating, daughter noticed that patient did not appear well and she was shuffling her feet.  Daughter stopped the car on the side of the road and called 911.  EMS arrived, EKG was done and showed a STEMI. Patient then became pulseless. She had 2 rounds of chest compression, and 1 treatments of epinephrine. EMS was able to get ROSC and patient was sent to the emergency department for further evaluation.  Social history: She lives at home with her daughter, [name].  Family denies history of tobacco use, EtOH, recreational drug use.  Vaccination history: Patient is vaccinated for COVID-19 including boosters.  ROS: Unable to complete due to acuity and advanced dementia.  ED Course: Discussed with emergency medicine provider, patient requiring hospitalization for chief concerns of STEMI in the field.  Vitals in the emergency department showed temperature  of 95.9, respiration rate of 22, initial heart rate of 66 and now improved to 73, initial blood pressure 92/60, improved to 107/74, patient was taken to the emergency department on nonrebreather with an SPO2 saturation of 92%.  Labs in the emergency department showed serum sodium of 139, potassium 3.7, chloride 114, bicarb 17, BUN of 35, serum creatinine of 1.34, nonfasting blood glucose 165, GFR 37.  AST was elevated at 91.  ALT 51.  Albumin was 3.1.  Patient had leukocytosis of 12.4, hemoglobin 12.6, platelets of 180.  Her lipid panel was within normal limits.  COVID/influenza A/influenza B PCR were negative.  Prior to presentation to the emergency department, EMS was called on the road and patient was found to have a STEMI and pulseless.  Patient underwent 2 rounds of CPR, and 1 dose of epinephrine.  Patient got ROSC and was sent to the emergency department for further evaluation.  Cardiology STEMI service was consulted and recommended avoid aggressive intervention.  In the emergency department patient was given heparin GTT, aspirin 324 mg p.o. once.  Hospital course: Extensive discussion with family at bedside regarding CODE STATUS and poor prognosis.  Well at bedside, patient vomited and I explained to the family that patient will likely have aspiration pneumonia at this time and this is frequently seen in patients sustaining chest compression.  Review I did I reevaluated patient at bedside.  Patient is sitting up and alert and does not appear to be in acute distress at this time.  Patient was able to tell me her name and unable to tell  me where she is, her current age, the current calendar year.  When I ask her to identify her daughter [name] whom she lives with at bedside she does states that this is her family.  She was not able to tell me [name]'s name and or her that [name] is her daughter.    Assessment/Plan  Principal Problem:   STEMI (ST elevation myocardial infarction) [facility]) Active  Problems:   Essential hypertension   Hypothyroidism (acquired)   Hypothermia   Aspiration pneumonia (HCC)   Leukocytosis   Dementia (HCC)   # STEMI - Continue heparin at this time as family is not ready for comfort measures - Complete echo ordered for a.m. of 2021/08/03 - Agree with cardiology, patient is not a candidate for intravascular intervention at this time - Goal blood glucose control is 140-180: Insulin SSI with at bedtime coverage has been ordered  # V. tach on telemetry-amiodarone 150 once - Amiodarone 150 IV bolus as needed for V. tach ordered  # End-of-life and goals of care discussion - Extensive discussion was done with both healthcare power of attorney, daughters at bedside, [name] and [name] - Both daughters did endorse that they did not want patient to have further chest compression as this would cause her more pain - Daughters however is not ready for comfort care at this time -They endorsed to me that they want their mother to live however they also do not want her to suffer in pain - Chaplain for spiritual consultation has been ordered - If family is not ready to make a comfort care decision, would recommend a.m. team to consult palliative team for further assistance  # History of hypertension-currently patient is borderline normotensive to low normotensive to hypotension at this time - I will hold all antihypertensive medications  # History of hypothyroidism-levothyroxine 112 mcg daily resumed for 08/02/2019 2 in the AM  # Hyperlipidemia-patient was formally on lovastatin-I will increase to high intensity atorvastatin 80 mg  # Leukocytosis # At risk of aspiration pna - Zosyn per pharmacy for aspiration pneumonia ordered  # Moderate to advance dementia-appears to be at baseline per family  # As needed medications: Midazolam 2 mg IV every 1 hours as needed for seizures, 2 doses ordered; morphine 2 mg IV every 3 hours as needed for severe pain, 1 day ordered,  Zofran, Phenergan, insulin SSI with at bedtime coverage  # CODE STATUS-confirmed multiple times with both [name] and [name] at bedside that they do not want chest compressions.  When I ask about intubation and/or if patient is not able to breathe with daughters want patient to have a tube and a machine to help breathe for her and they endorse no. [name] would like to continue heparin GGT, antibiotics, and as needed medications as above  Vaginal bleeding ***  Chart reviewed.   DVT prophylaxis: Heparin GGT Code Status: DNR/DNI Diet: N.p.o. Family Communication: Extensive discussion with daughters and healthcare power of attorney at bedside, [name] and [name] Disposition Plan: Poor prognosis, anticipate death in the hospital Consults called: Cardiology Admission status: Stepdown, inpatient  Past Medical History:  Diagnosis Date   Dementia (HCC)    Hyperlipidemia    Hypertension    Mitral stenosis    History reviewed. No pertinent surgical history.  Social History:  reports that she has never smoked. She has never used smokeless tobacco. She reports that she does not drink alcohol and does not use drugs.  No Known Allergies Family History  Problem Relation Age  of Onset   Osteoarthritis Daughter    Family history: Family history reviewed and not pertinent  Prior to Admission medications   Medication Sig Start Date End Date Taking? Authorizing Provider  benazepril (LOTENSIN) 20 MG tablet Take 20 mg by mouth daily. 07/26/21  Yes [provider]  citalopram (CELEXA) 10 MG tablet Take 10 mg by mouth daily. 07/26/21  Yes [provider]  donepezil (ARICEPT) 5 MG tablet Take 5 mg by mouth daily. 07/26/21  Yes [provider]  fluticasone (FLONASE) 50 MCG/ACT nasal spray Place 2 sprays into both nostrils daily at 6 (six) AM. 07/26/21  Yes [provider]  levothyroxine (SYNTHROID) 112 MCG tablet Take 112 mcg by mouth daily. 07/26/21  Yes [provider]  lovastatin (MEVACOR) 40 MG tablet Take 40 mg by mouth daily. 07/26/21  Yes [provider]  memantine (NAMENDA XR) 14 MG CP24 24 hr capsule Take 14 mg by mouth daily at 6 (six) AM. 07/26/21  Yes [provider]   Physical Exam: Vitals:   06-Aug-2021 1743 08/06/21 2015 08-06-2021 2100 08-06-2021 2130  BP:  122/79 126/75 117/71  Pulse:  62 66 79  Resp:  (!) 23 (!) 25 19  Temp: (!) 95.9 F (35.5 C)     TempSrc: Axillary     SpO2:  100% 100% 100%  Weight:      Height:       Constitutional: appears age-appropriate, frail, cachectic, NAD, calm, comfortable Eyes: PERRL, lids and conjunctivae normal HENMT: Bilateral temporal wasting, mucous membranes are dry. Posterior pharynx clear of any exudate or lesions. Age-appropriate dentition.  Unable to assess hearing Neck: normal, supple, no masses, no thyromegaly Respiratory: clear to auscultation bilaterally, no wheezing, no crackles.  Mild increase respiratory effort. No accessory muscle use.  Cardiovascular: Irregular rate and rhythm. no murmurs / rubs / gallops. No extremity edema. 2+ pedal pulses. No carotid bruits.  Abdomen: Obese abdomen, no tenderness, no masses palpated, no hepatosplenomegaly. Bowel sounds positive.  Musculoskeletal: no clubbing / cyanosis. No joint deformity upper and lower extremities. Good ROM, no contractures, no atrophy. Normal muscle tone.  Skin: no rashes, lesions, ulcers. No induration Neurologic: Sensation intact. Strength 5/5 in all 4.  Psychiatric: Acute situation, lacks judgment and insight. Alert and oriented x self.  Depressed mood.  EKG: independently reviewed, showing initial EKG in the field which showed a STEMI.  Repeat EKG on initial ED presentation showed sinus tachycardia with rate of 113, right bundle branch block, QTC 630.  Another repeat of EKG showed low amplitude, sinus rhythm with rate of 67, QTc 515  Chest x-ray on Admission: I personally reviewed and I agree with  radiologist reading as below.  DG Chest Port 1 View  Result Date: 06-Aug-2021 CLINICAL DATA:  Chest pain EXAM: PORTABLE CHEST 1 VIEW COMPARISON:  05/10/2007 FINDINGS: There is poor inspiration. Transverse diameter of heart is increased. Central pulmonary vessels are prominent. There is no focal pulmonary consolidation. Left hemidiaphragm is elevated. There is blunting of left lateral CP angle. There is no pneumothorax. Severe degenerative changes are noted in the left shoulder. Degenerative changes to a lesser extent are noted in the right shoulder. IMPRESSION: Cardiomegaly. There are no signs of alveolar pulmonary edema or focal pulmonary consolidation. Blunting of left lateral CP angle may be due to minimal effusion or pleural thickening. Electronically Signed   By: Ernie Avena M.D.   On: 08/06/21 17:22    Labs on Admission: I have personally reviewed following labs  CBC: Recent Labs  Lab 08/28/2021 1650  WBC 12.4*  NEUTROABS 4.5  HGB 12.6  HCT 40.4  MCV 94.2  PLT 180   Basic Metabolic Panel: Recent Labs  Lab 08/21/2021 1650  NA 139  K 3.7  CL 114*  CO2 17*  GLUCOSE 165*  BUN 35*  CREATININE 1.34*  CALCIUM 7.3*   GFR: Estimated Creatinine Clearance: 27.6 mL/min (A) (by C-G formula based on SCr of 1.34 mg/dL (H)).  Liver Function Tests: Recent Labs  Lab 08/24/2021 1650  AST 91*  ALT 51*  ALKPHOS 67  BILITOT 1.1  PROT 5.5*  ALBUMIN 3.1*   Coagulation Profile: Recent Labs  Lab 08/02/2021 1650  INR 1.4*   Lipid Profile: Recent Labs    08/16/2021 1650  CHOL 125  HDL 43  LDLCALC 60  TRIG 112  CHOLHDL 2.9   CRITICAL CARE Performed by: Nadyne Coombes Brian Zeitlin  Total critical care time: 60 minutes  Critical care time was exclusive of separately billable procedures and treating other patients.  Critical care was necessary to treat or prevent imminent or life-threatening deterioration.  Cardiac arrest, circulatory failure  Critical care was time spent personally by me  on the following activities: development of treatment plan with patient and/or surrogate as well as nursing, discussions with consultants, evaluation of patient's response to treatment, examination of patient, obtaining history from patient or surrogate, ordering and performing treatments and interventions, ordering and review of laboratory studies, ordering and review of radiographic studies, pulse oximetry and re-evaluation of patient's condition.  Dr. Sedalia Muta [facility]  If 7PM-7AM, please contact overnight-coverage provider If 7AM-7PM, please contact day coverage provider [email or web address]  07/29/2021, 9:45 PM

## 2021-07-31 NOTE — Progress Notes (Signed)
Pt responded to room 16. No family present. Continued support available per on call chaplain.

## 2021-08-01 ENCOUNTER — Ambulatory Visit: Payer: Medicare HMO

## 2021-08-01 DIAGNOSIS — N939 Abnormal uterine and vaginal bleeding, unspecified: Secondary | ICD-10-CM

## 2021-08-01 LAB — MRSA NEXT GEN BY PCR, NASAL: MRSA by PCR Next Gen: NOT DETECTED

## 2021-08-02 LAB — HEMOGLOBIN A1C
Hgb A1c MFr Bld: 5.8 % — ABNORMAL HIGH (ref 4.8–5.6)
Mean Plasma Glucose: 120 mg/dL

## 2021-08-29 NOTE — Significant Event (Addendum)
Pt became apneic and bradycardic. Family at bedside, chaplain called. Pronounced by Dr Sedalia Muta and this nurse at [phone]

## 2021-08-29 NOTE — Discharge Summary (Signed)
Discharge Summary  Ms. Terry Shaw is a 88 year old female with medical history of hypertension, moderate to advanced dementia, hypothyroid, hyperlipidemia, who presented to the emergency department from EMS for chief concerns of STEMI.  Please see full H&P for hospital course.  I received message from nursing staff that patient is actively dying. I presented to bedside and pronounced death.   Vitals: RR: 0, HR 0  Physical exam Patient unresponsive to noxious stimuli. Pupils fixed and dilated. No spontaneous respirations.  No auscultation heart sounds.  No auscultated respiration. No auscultated bowel sounds.  No palpable pulse in the right radial or carotid.  Patient pronounced deceased at 02:04 on 08/02/2021  Dr. Sedalia Muta [facility]

## 2021-08-29 NOTE — Progress Notes (Signed)
Chaplain Maggie offered grief support after pt's death. Family at the bedside. Hospitality and prayer were shared.

## 2021-08-29 DEATH — deceased

## 2023-05-06 IMAGING — DX DG CHEST 1V PORT
1 series · 1 of 1 positions shown · non-contrast
Comparison: 05/10/2007

CLINICAL DATA: Chest pain

EXAM:
PORTABLE CHEST 1 VIEW

[chest ap]
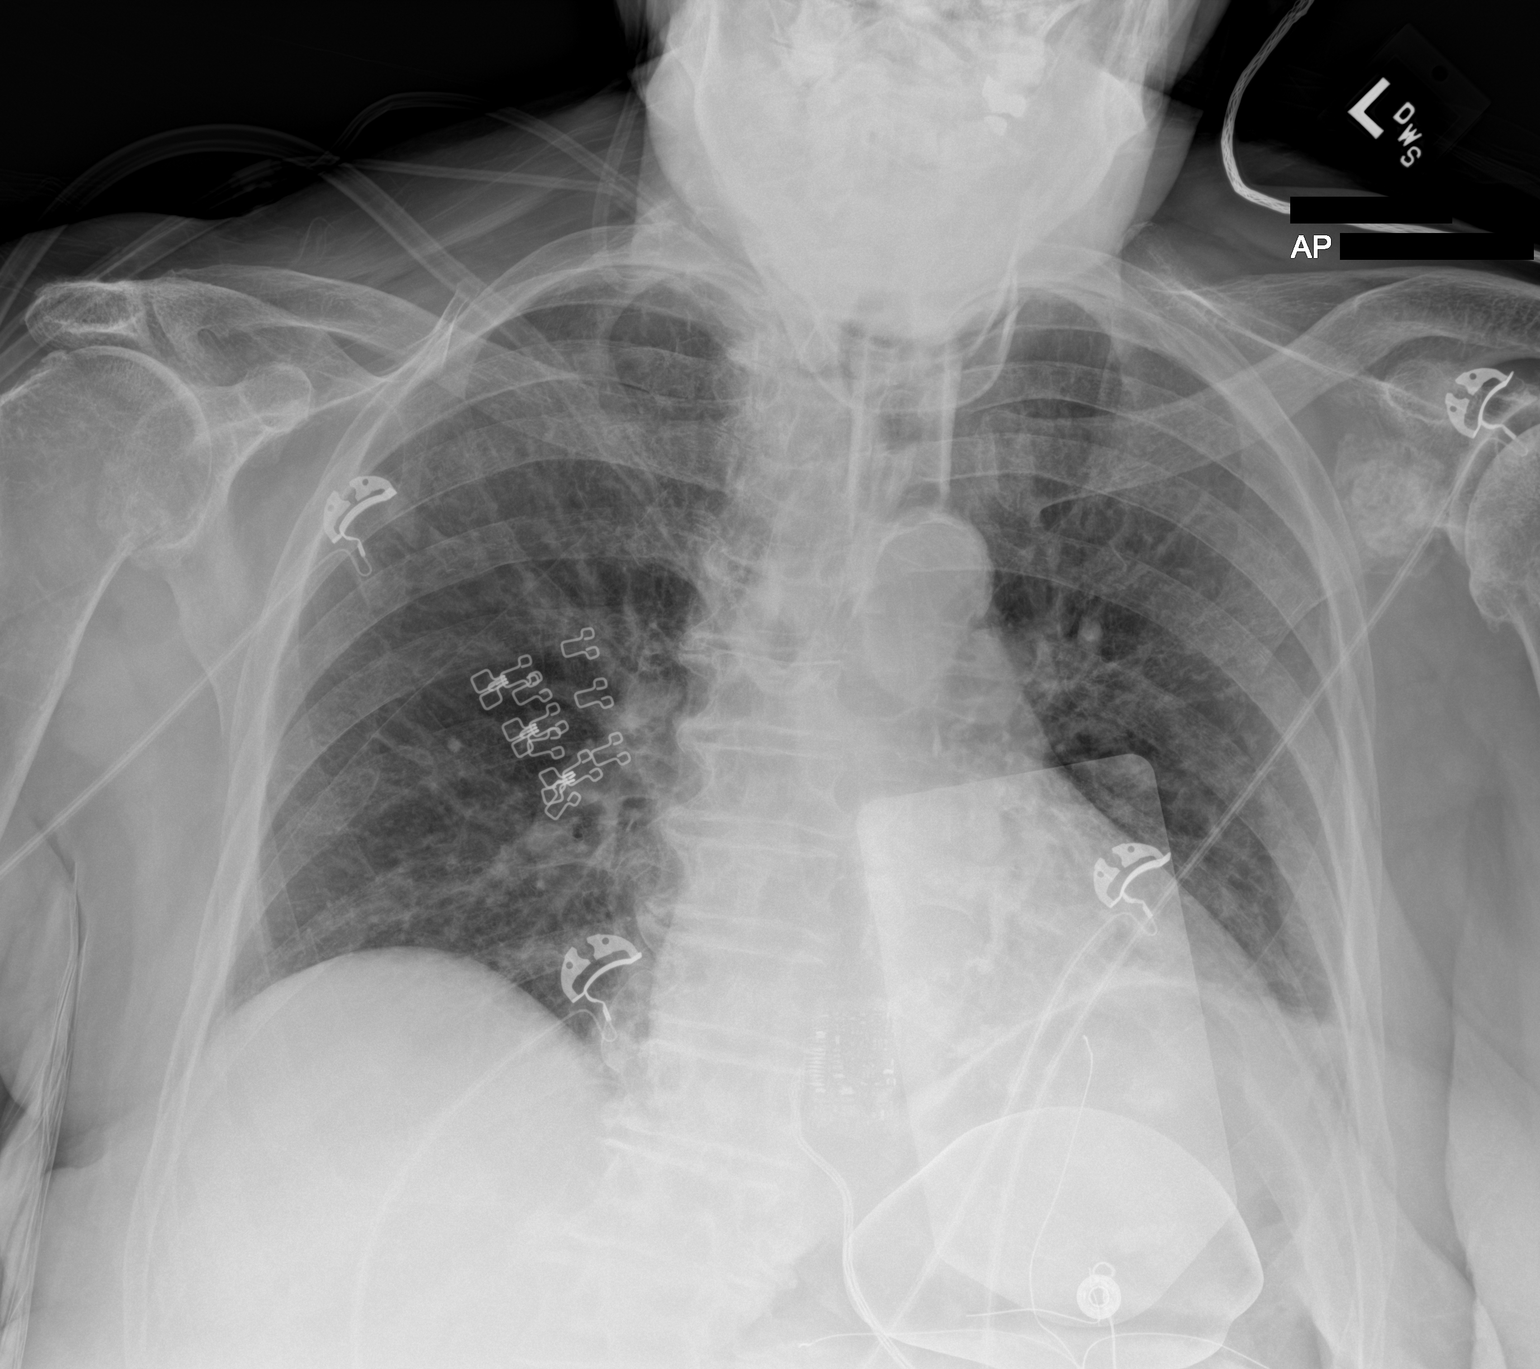

[1 of 1 positions shown; findings below may reference images not displayed]

FINDINGS: There is poor inspiration. Transverse diameter of heart is
increased. Central pulmonary vessels are prominent. There is no
focal pulmonary consolidation. Left hemidiaphragm is elevated. There
is blunting of left lateral CP angle. There is no pneumothorax.
Severe degenerative changes are noted in the left shoulder.
Degenerative changes to a lesser extent are noted in the right
shoulder.
IMPRESSION: Cardiomegaly. There are no signs of alveolar pulmonary edema or
focal pulmonary consolidation. Blunting of left lateral CP angle may
be due to minimal effusion or pleural thickening.
